# Patient Record
Sex: Male | Born: 1960 | Race: White | Hispanic: No | Smoking: Never smoker
Health system: Southern US, Community
[De-identification: ages and names within clinical notes are randomized; demographics above are authoritative.]

## PROBLEM LIST (undated history)

## (undated) DIAGNOSIS — K859 Acute pancreatitis without necrosis or infection, unspecified: Secondary | ICD-10-CM

## (undated) DIAGNOSIS — F1020 Alcohol dependence, uncomplicated: Secondary | ICD-10-CM

---

## 2003-07-08 ENCOUNTER — Inpatient Hospital Stay (HOSPITAL_COMMUNITY): Admission: AD | Admit: 2003-07-08 | Discharge: 2003-07-12 | Payer: Self-pay | Admitting: Psychiatry

## 2019-06-08 ENCOUNTER — Other Ambulatory Visit: Payer: Self-pay

## 2019-06-08 DIAGNOSIS — Z20822 Contact with and (suspected) exposure to covid-19: Secondary | ICD-10-CM

## 2019-06-13 LAB — NOVEL CORONAVIRUS, NAA: SARS-CoV-2, NAA: NOT DETECTED

## 2019-07-27 ENCOUNTER — Emergency Department (HOSPITAL_COMMUNITY): Payer: Self-pay

## 2019-07-27 ENCOUNTER — Encounter (HOSPITAL_COMMUNITY): Payer: Self-pay

## 2019-07-27 ENCOUNTER — Emergency Department (HOSPITAL_COMMUNITY)
Admission: EM | Admit: 2019-07-27 | Discharge: 2019-07-27 | Disposition: A | Discharge status: Home / Self Care | Payer: Self-pay | Attending: Emergency Medicine | Admitting: Emergency Medicine

## 2019-07-27 ENCOUNTER — Other Ambulatory Visit: Payer: Self-pay

## 2019-07-27 DIAGNOSIS — R911 Solitary pulmonary nodule: Secondary | ICD-10-CM | POA: Diagnosis present

## 2019-07-27 DIAGNOSIS — R1010 Upper abdominal pain, unspecified: Secondary | ICD-10-CM

## 2019-07-27 DIAGNOSIS — F1010 Alcohol abuse, uncomplicated: Secondary | ICD-10-CM | POA: Insufficient documentation

## 2019-07-27 DIAGNOSIS — Z20828 Contact with and (suspected) exposure to other viral communicable diseases: Secondary | ICD-10-CM | POA: Insufficient documentation

## 2019-07-27 DIAGNOSIS — R933 Abnormal findings on diagnostic imaging of other parts of digestive tract: Secondary | ICD-10-CM | POA: Diagnosis present

## 2019-07-27 DIAGNOSIS — F10929 Alcohol use, unspecified with intoxication, unspecified: Secondary | ICD-10-CM | POA: Insufficient documentation

## 2019-07-27 DIAGNOSIS — K860 Alcohol-induced chronic pancreatitis: Secondary | ICD-10-CM | POA: Insufficient documentation

## 2019-07-27 DIAGNOSIS — Z8551 Personal history of malignant neoplasm of bladder: Secondary | ICD-10-CM | POA: Insufficient documentation

## 2019-07-27 DIAGNOSIS — K8689 Other specified diseases of pancreas: Secondary | ICD-10-CM | POA: Diagnosis present

## 2019-07-27 DIAGNOSIS — R109 Unspecified abdominal pain: Secondary | ICD-10-CM

## 2019-07-27 HISTORY — DX: Acute pancreatitis without necrosis or infection, unspecified: K85.90

## 2019-07-27 HISTORY — DX: Alcohol dependence, uncomplicated: F10.20

## 2019-07-27 LAB — COMPREHENSIVE METABOLIC PANEL
ALT: 51 U/L — ABNORMAL HIGH (ref 0–44)
AST: 105 U/L — ABNORMAL HIGH (ref 15–41)
Albumin: 4.1 g/dL (ref 3.5–5.0)
Alkaline Phosphatase: 87 U/L (ref 38–126)
Anion gap: 16 — ABNORMAL HIGH (ref 5–15)
BUN: 14 mg/dL (ref 6–20)
CO2: 25 mmol/L (ref 22–32)
Calcium: 8.5 mg/dL — ABNORMAL LOW (ref 8.9–10.3)
Chloride: 94 mmol/L — ABNORMAL LOW (ref 98–111)
Creatinine, Ser: 0.79 mg/dL (ref 0.61–1.24)
GFR calc Af Amer: >60 mL/min (ref >60)
GFR calc non Af Amer: >60 mL/min (ref >60)
Glucose, Bld: 182 mg/dL — ABNORMAL HIGH (ref 70–99)
Potassium: 3 mmol/L — ABNORMAL LOW (ref 3.5–5.1)
Sodium: 135 mmol/L (ref 135–145)
Total Bilirubin: 1.4 mg/dL — ABNORMAL HIGH (ref 0.3–1.2)
Total Protein: 7 g/dL (ref 6.5–8.1)

## 2019-07-27 LAB — BASIC METABOLIC PANEL
Anion gap: 14 (ref 5–15)
BUN: 14 mg/dL (ref 6–20)
CO2: 23 mmol/L (ref 22–32)
Calcium: 7.6 mg/dL — ABNORMAL LOW (ref 8.9–10.3)
Chloride: 97 mmol/L — ABNORMAL LOW (ref 98–111)
Creatinine, Ser: 0.83 mg/dL (ref 0.61–1.24)
GFR calc Af Amer: >60 mL/min (ref >60)
GFR calc non Af Amer: >60 mL/min (ref >60)
Glucose, Bld: 134 mg/dL — ABNORMAL HIGH (ref 70–99)
Potassium: 3.7 mmol/L (ref 3.5–5.1)
Sodium: 134 mmol/L — ABNORMAL LOW (ref 135–145)

## 2019-07-27 LAB — CBC WITH DIFFERENTIAL/PLATELET
Abs Immature Granulocytes: 0.01 10*3/uL (ref 0.00–0.07)
Basophils Absolute: 0 10*3/uL (ref 0.0–0.1)
Basophils Relative: 0 %
Eosinophils Absolute: 0 10*3/uL (ref 0.0–0.5)
Eosinophils Relative: 1 %
HCT: 46.7 % (ref 39.0–52.0)
Hemoglobin: 16.7 g/dL (ref 13.0–17.0)
Immature Granulocytes: 0 %
Lymphocytes Relative: 18 %
Lymphs Abs: 0.9 10*3/uL (ref 0.7–4.0)
MCH: 30.5 pg (ref 26.0–34.0)
MCHC: 35.8 g/dL (ref 30.0–36.0)
MCV: 85.4 fL (ref 80.0–100.0)
Monocytes Absolute: 0.7 10*3/uL (ref 0.1–1.0)
Monocytes Relative: 13 %
Neutro Abs: 3.5 10*3/uL (ref 1.7–7.7)
Neutrophils Relative %: 68 %
Platelets: 173 10*3/uL (ref 150–400)
RBC: 5.47 MIL/uL (ref 4.22–5.81)
RDW: 12.1 % (ref 11.5–15.5)
WBC: 5.2 10*3/uL (ref 4.0–10.5)
nRBC: 0 % (ref 0.0–0.2)

## 2019-07-27 LAB — RAPID URINE DRUG SCREEN, HOSP PERFORMED
Amphetamines: NOT DETECTED
Barbiturates: NOT DETECTED
Benzodiazepines: NOT DETECTED
Cocaine: NOT DETECTED
Opiates: NOT DETECTED
Tetrahydrocannabinol: POSITIVE — AB

## 2019-07-27 LAB — SARS CORONAVIRUS 2 BY RT PCR (HOSPITAL ORDER, PERFORMED IN ~~LOC~~ HOSPITAL LAB): SARS Coronavirus 2: NEGATIVE

## 2019-07-27 LAB — URINALYSIS, ROUTINE W REFLEX MICROSCOPIC
Bilirubin Urine: NEGATIVE
Glucose, UA: NEGATIVE mg/dL
Hgb urine dipstick: NEGATIVE
Ketones, ur: NEGATIVE mg/dL
Leukocytes,Ua: NEGATIVE
Nitrite: NEGATIVE
Protein, ur: NEGATIVE mg/dL
Specific Gravity, Urine: 1.013 (ref 1.005–1.030)
pH: 7 (ref 5.0–8.0)

## 2019-07-27 LAB — TROPONIN I (HIGH SENSITIVITY)
Troponin I (High Sensitivity): 4 ng/L (ref <18)
Troponin I (High Sensitivity): 3 ng/L (ref <18)

## 2019-07-27 LAB — LIPASE, BLOOD: Lipase: 12 U/L (ref 11–51)

## 2019-07-27 LAB — ETHANOL: Alcohol, Ethyl (B): 133 mg/dL — ABNORMAL HIGH (ref <10)

## 2019-07-27 MED ORDER — FAMOTIDINE IN NACL 20-0.9 MG/50ML-% IV SOLN
20.0000 mg | Freq: Once | INTRAVENOUS | Status: AC
  Administered 2019-07-27: 20 mg via INTRAVENOUS
  Filled 2019-07-27: qty 50

## 2019-07-27 MED ORDER — CHLORDIAZEPOXIDE HCL 25 MG PO CAPS
25.0000 mg | ORAL_CAPSULE | Freq: Once | ORAL | Status: AC
  Administered 2019-07-27: 25 mg via ORAL
  Filled 2019-07-27: qty 1

## 2019-07-27 MED ORDER — LORAZEPAM 2 MG/ML IJ SOLN
1.0000 mg | Freq: Once | INTRAMUSCULAR | Status: AC
  Administered 2019-07-27: 17:00:00 1 mg via INTRAVENOUS
  Filled 2019-07-27: qty 1

## 2019-07-27 MED ORDER — SUCRALFATE 1 G PO TABS: 1.0000 g | ORAL_TABLET | Freq: Three times a day (TID) | ORAL | 0 refills | Status: AC

## 2019-07-27 MED ORDER — ONDANSETRON HCL 4 MG/2ML IJ SOLN
4.0000 mg | Freq: Once | INTRAMUSCULAR | Status: AC
  Administered 2019-07-27: 4 mg via INTRAVENOUS
  Filled 2019-07-27: qty 2

## 2019-07-27 MED ORDER — PANTOPRAZOLE SODIUM 20 MG PO TBEC: 20.0000 mg | DELAYED_RELEASE_TABLET | Freq: Every day | ORAL | 0 refills | Status: AC

## 2019-07-27 MED ORDER — HYDROMORPHONE HCL 1 MG/ML IJ SOLN
0.5000 mg | Freq: Once | INTRAMUSCULAR | Status: AC
  Administered 2019-07-27: 14:00:00 0.5 mg via INTRAVENOUS
  Filled 2019-07-27: qty 1

## 2019-07-27 MED ORDER — PROMETHAZINE HCL 25 MG RE SUPP: 25.0000 mg | Freq: Four times a day (QID) | RECTAL | 0 refills | Status: AC | PRN

## 2019-07-27 MED ORDER — POTASSIUM CHLORIDE CRYS ER 20 MEQ PO TBCR
40.0000 meq | EXTENDED_RELEASE_TABLET | Freq: Once | ORAL | Status: AC
  Administered 2019-07-27: 40 meq via ORAL
  Filled 2019-07-27: qty 2

## 2019-07-27 MED ORDER — PROCHLORPERAZINE EDISYLATE 10 MG/2ML IJ SOLN
5.0000 mg | Freq: Once | INTRAMUSCULAR | Status: AC
  Administered 2019-07-27: 5 mg via INTRAVENOUS
  Filled 2019-07-27: qty 2

## 2019-07-27 MED ORDER — HYDROMORPHONE HCL 1 MG/ML IJ SOLN
0.5000 mg | Freq: Once | INTRAMUSCULAR | Status: AC
  Administered 2019-07-27: 0.5 mg via INTRAVENOUS
  Filled 2019-07-27: qty 1

## 2019-07-27 MED ORDER — AMOXICILLIN-POT CLAVULANATE 875-125 MG PO TABS: 1.0000 | ORAL_TABLET | Freq: Two times a day (BID) | ORAL | 0 refills | Status: AC

## 2019-07-27 MED ORDER — THIAMINE HCL 100 MG/ML IJ SOLN
Freq: Once | INTRAVENOUS | Status: AC
  Administered 2019-07-27: 12:00:00 via INTRAVENOUS
  Filled 2019-07-27: qty 1000

## 2019-07-27 MED ORDER — SODIUM CHLORIDE 0.9 % IV SOLN
INTRAVENOUS | Status: DC | PRN
  Administered 2019-07-27: 500 mL via INTRAVENOUS

## 2019-07-27 MED ORDER — CHLORDIAZEPOXIDE HCL 25 MG PO CAPS: ORAL_CAPSULE | ORAL | 0 refills | Status: AC

## 2019-07-27 MED ORDER — IOHEXOL 300 MG/ML  SOLN
100.0000 mL | Freq: Once | INTRAMUSCULAR | Status: AC | PRN
  Administered 2019-07-27: 100 mL via INTRAVENOUS

## 2019-07-27 MED ORDER — PANTOPRAZOLE SODIUM 40 MG IV SOLR
40.0000 mg | Freq: Once | INTRAVENOUS | Status: AC
  Administered 2019-07-27: 10:00:00 40 mg via INTRAVENOUS
  Filled 2019-07-27: qty 40

## 2019-07-27 NOTE — ED Triage Notes (Signed)
Pt reports history of pancreatitis.  Has been sober for 10 years and started drinking again 5 days ago.  C/O upper abd pain, n/v/d.

## 2019-07-27 NOTE — ED Provider Notes (Signed)
I assumed care of patient from previous team, please see their note for full H and P.  Patient is here with a history of pancreatitis, he has been sober for 10 years and started drinking 2 weeks ago.   Physical Exam  BP (!) 150/92   Pulse 96   Temp 98 F (36.7 C)   Resp 15   Ht 5\' 7"  (1.702 m)   Wt 59 kg   SpO2 100%   BMI 20.36 kg/m   Physical Exam Vitals signs and nursing note reviewed.  Constitutional:      General: He is not in acute distress.    Appearance: He is well-developed. He is not diaphoretic.  HENT:     Head: Normocephalic and atraumatic.  Eyes:     General: No scleral icterus.       Right eye: No discharge.        Left eye: No discharge.     Conjunctiva/sclera: Conjunctivae normal.  Neck:     Musculoskeletal: Normal range of motion.  Cardiovascular:     Rate and Rhythm: Normal rate and regular rhythm.  Pulmonary:     Effort: Pulmonary effort is normal. No respiratory distress.     Breath sounds: No stridor.  Abdominal:     General: There is no distension.  Musculoskeletal:        General: No deformity.  Skin:    General: Skin is warm and dry.  Neurological:     General: No focal deficit present.     Mental Status: He is alert.     Motor: No abnormal muscle tone.  Psychiatric:        Mood and Affect: Mood is anxious.        Behavior: Behavior normal.     ED Course/Procedures   Clinical Course as of Jul 27 14  Thu Jul 27, 2019  1660 Repeat BMP shows that anion gap is resolved, potassium is appropriately increased from 3 up to 3.7  Anion gap: 14 [EH]  1943 Results discussed with patient.    [EH]    Clinical Course User Index [EH] Cristina Gong, PA-C    Procedures  Ct Abdomen Pelvis W Contrast  Result Date: 07/27/2019 CLINICAL DATA:  Chronic pancreatitis.  Possible pseudocyst. EXAM: CT ABDOMEN AND PELVIS WITH CONTRAST TECHNIQUE: Multidetector CT imaging of the abdomen and pelvis was performed using the standard protocol following  bolus administration of intravenous contrast. CONTRAST:  OMNIPAQUE IOHEXOL 300 MG/ML  SOLN COMPARISON:  None. FINDINGS: Lower chest: Normal heart size. Lung bases are clear. No large area pulmonary consolidation. There is a 2 mm left lower lobe nodule. No pleural effusion. Hepatobiliary: The liver is normal in size and contour. Subcentimeter too small to characterize low-attenuation lesions within the right and left hepatic lobes. Prior cholecystectomy. No intrahepatic or extrahepatic biliary ductal dilatation. Pancreas: Atrophy of the pancreatic head/uncinate process with focal calcifications. Within the pancreatic body there is a 1.6 x 1.2 cm low-attenuation lesion (image 24; series 2). Mild atrophy of the pancreatic body and tail. Spleen: Unremarkable Adrenals/Urinary Tract: Normal adrenal glands. 1.3 cm cyst mid pole right kidney. Kidneys enhance symmetrically with contrast. Urinary bladder is unremarkable. Stomach/Bowel: There is circumferential wall thickening of the descending and sigmoid colon extending to the level of the rectum. Additionally, there is focal wall thickening involving the cecum (image 45; series 2). Normal morphology of the stomach. No significant free fluid or free intraperitoneal air. Vascular/Lymphatic: Normal caliber abdominal aorta. No retroperitoneal lymphadenopathy.  Multiple upper abdominal collateral vessels are demonstrated. Reproductive: Unremarkable. Other: Small focal nonspecific calcifications within the mesentery (image 44; series 2). Musculoskeletal: Osseous demineralization. Lumbar spine degenerative changes. IMPRESSION: There is a 1.6 cm low-attenuation lesion within the pancreatic body. No surrounding inflammatory change. This needs dedicated evaluation with pre and post contrast-enhanced MRI in the non acute setting. Calcifications within the pancreatic head/uncinate process as can be seen with chronic calcific pancreatitis. Marked wall thickening of the descending  and sigmoid colon as can be seen with colitis. There is focal wall thickening involving the cecum which is nonspecific. If the patient has not had recent colonoscopy, recommend further evaluation with colonoscopy after resolution of the acute symptomatology to exclude underlying colonic lesion. 2 mm left lower lobe pulmonary nodule. No follow-up needed if patient is low-risk. Non-contrast chest CT can be considered in 12 months if patient is high-risk. This recommendation follows the consensus statement: Guidelines for Management of Incidental Pulmonary Nodules Detected on CT Images: From the Fleischner Society 2017; Radiology 2017; 284:228-243. Electronically Signed   By: Annia Beltrew  Davis M.D.   On: 07/27/2019 18:50   [place] Abdomen Limited Ruq  Addendum Date: 07/27/2019   ADDENDUM REPORT: 07/27/2019 14:22 ADDENDUM: Following the initial exam, additional images of the pancreas were obtained and submitted for interpretation. FINDINGS: There is an ovoid hypoechoic structure in the region of the pancreatic head/neck with no internal vascularity on color Doppler. This area measures approximately 15 x 17 x 12 mm. It is unclear whether if this is within or adjacent to the pancreas. The visualized portions of the pancreas do not appear edematous. No definite areas peripancreatic fluid identified. No hyperemia of the peripancreatic tissues. The distal pancreatic body and tail segments as well as the uncinate process were not seen secondary to shadowing from bowel gas. IMPRESSION: 1. Limited sonographic evaluation of the pancreas secondary to shadowing from overlying bowel gas. 2. A small ovoid hypoechoic area in the region of the pancreatic head/neck which could represent a lesion within the pancreas versus a thrombosed vascular structure such as the superior mesenteric vein. Further evaluation of this finding with CT of the abdomen in the portal venous phase is recommended. Electronically Signed   By: Duanne GuessNicholas  Plundo M.D.    On: 07/27/2019 14:22   Result Date: 07/27/2019 CLINICAL DATA:  Epigastric pain EXAM: ULTRASOUND ABDOMEN LIMITED RIGHT UPPER QUADRANT COMPARISON:  None. FINDINGS: Gallbladder: Surgically absent. Common bile duct: Diameter: 11 mm Liver: No focal lesion identified. Within normal limits in parenchymal echogenicity. Portal vein is patent on color Doppler imaging with normal direction of blood flow towards the liver. Other: None. IMPRESSION: 1. Essentially unremarkable right upper quadrant ultrasound status post cholecystectomy. 2. Mildly prominent common bile duct, likely normal postsurgical appearance. Electronically Signed: By: Duanne GuessNicholas  Plundo M.D. On: 07/27/2019 12:34    Labs Reviewed  COMPREHENSIVE METABOLIC PANEL - Abnormal; Notable for the following components:      Result Value   Potassium 3.0 (*)    Chloride 94 (*)    Glucose, Bld 182 (*)    Calcium 8.5 (*)    AST 105 (*)    ALT 51 (*)    Total Bilirubin 1.4 (*)    Anion gap 16 (*)    All other components within normal limits  ETHANOL - Abnormal; Notable for the following components:   Alcohol, Ethyl (B) 133 (*)    All other components within normal limits  RAPID URINE DRUG SCREEN, HOSP PERFORMED - Abnormal; Notable for the following  components:   Tetrahydrocannabinol POSITIVE (*)    All other components within normal limits  BASIC METABOLIC PANEL - Abnormal; Notable for the following components:   Sodium 134 (*)    Chloride 97 (*)    Glucose, Bld 134 (*)    Calcium 7.6 (*)    All other components within normal limits  SARS CORONAVIRUS 2 (HOSPITAL ORDER, [facility])  LIPASE, BLOOD  CBC WITH DIFFERENTIAL/PLATELET  URINALYSIS, ROUTINE W REFLEX MICROSCOPIC  TROPONIN I (HIGH SENSITIVITY)  TROPONIN I (HIGH SENSITIVITY)    MDM  Plan is to follow-up on CT scan.  Patient most likely will be able to be discharged home.    CT scan shows multiple incidental findings, all of which were discussed with the  patient along with the need for follow-up.  CT scan shows evidence of colitis.  Based on his history of alcohol use Cipro Flagyl would not be an appropriate combination and he will be discharged with Augmentin instead.  He will be given a Librium taper.  PMP was consulted.  He wishes to stop drinking alcohol.  We discussed risks of taking multiple sedating substances including drinking alcohol while taking Librium and he states his understanding.  His pain is consistent with his chronic pancreatitis.  Recommended a clear liquid diet for 2 to 3 days and then increasing diet as tolerated.  We will also treat for alcoholic gastritis with Protonix and Carafate.  He did initially have a slight anion gap, however repeat BMP was improved.    Return precautions were discussed with patient who states their understanding.  At the time of discharge patient denied any unaddressed complaints or concerns.  Patient is agreeable for discharge home.  Allergies as of 07/27/2019   No Known Allergies     Medication List    TAKE these medications   amoxicillin-clavulanate 875-125 MG tablet Commonly known as: AUGMENTIN Take 1 tablet by mouth every 12 (twelve) hours.   chlordiazePOXIDE 25 MG capsule Commonly known as: LIBRIUM 50mg  PO TID x 1D, then 25-50mg  PO BID X 1D, then 25-50mg  PO QD X 1D   pantoprazole 20 MG tablet Commonly known as: PROTONIX Take 1 tablet (20 mg total) by mouth daily.   promethazine 25 MG suppository Commonly known as: PHENERGAN Place 1 suppository (25 mg total) rectally every 6 (six) hours as needed for nausea or vomiting.   sucralfate 1 g tablet Commonly known as: Carafate Take 1 tablet (1 g total) by mouth 4 (four) times daily -  with meals and at bedtime for 14 days.     ASK your doctor about these medications   Benadryl Allergy 25 mg capsule Generic drug: diphenhydrAMINE Take 25 mg by mouth at bedtime as needed.   Creon 36000 UNITS Cpep capsule Generic drug:  lipase/protease/amylase Take 2 capsules by mouth 3 (three) times daily.   omeprazole 20 MG capsule Commonly known as: PRILOSEC Take 1 capsule by mouth daily as needed.         Lorin Glass, PA-C 07/28/19 0018    Hayden Rasmussen, MD 07/28/19 1058

## 2019-07-27 NOTE — ED Notes (Signed)
Pt ambulated to bathroom in hallway with steady gait

## 2019-07-27 NOTE — ED Provider Notes (Signed)
[facility] EMERGENCY DEPARTMENT Provider Note   CSN: [id number] Arrival date & time: 07/27/19  0908     History   Chief Complaint Chief Complaint  Patient presents with  . Abdominal Pain    HPI Parker Payne is a 58 y.o. male.     Patient is a 58 year old male who presents to the emergency department with a complaint of abdominal pain.  The patient has a history of chronic pancreatitis, diverticulosis, alcoholism, postcholecystectomy, and bladder cancer.  The patient states that he has chronic pancreatitis.  He complains of increasing pain mostly in the epigastric area and just under the sternum.  At times the pain radiates to his back.  He says he had been sober for approximately 10years, but in the last 2 weeks he had restarted drinking.  His last drink was a few hours before he arrived in the emergency department.  The patient states that he has been having increasing shortness of breath since starting back drinking.  He says that he continued drinking because he was fearful of delirium tremens or other alcohol related problems.  The patient denies any rectal bleeding, he has not had any fever or chills.  He has had some diarrhea, that he says is oily and loose.  He says he is experiencing some similar stools when his pancreas was acting up.  The patient denies any hemoptysis, no melena reported.  The patient denies any recent injury or trauma to the abdomen.  No recent operations or procedures to be reported.  The patient denies use of tobacco products or recreational drugs.  He presents to the emergency department for evaluation and for assistance with his pain.  The history is provided by the patient.  Abdominal Pain Associated symptoms: diarrhea and nausea   Associated symptoms: no chest pain, no chills, no cough, no dysuria, no fever, no hematuria and no shortness of breath     Past Medical History:  Diagnosis Date  . Alcoholism (HCC)   . Pancreatitis     There are no  active problems to display for this patient.   History reviewed. No pertinent surgical history.      Home Medications    Prior to Admission medications   Not on File    Family History No family history on file.  Social History Social History   Tobacco Use  . Smoking status: Never Smoker  . Smokeless tobacco: Never Used  Substance Use Topics  . Alcohol use: Yes    Comment: daily x 5 days  . Drug use: Never     Allergies   Patient has no known allergies.   Review of Systems Review of Systems  Constitutional: Negative for activity change, chills and fever.       All ROS Neg except as noted in HPI  HENT: Negative.   Eyes: Negative for photophobia and discharge.  Respiratory: Negative for cough, shortness of breath and wheezing.   Cardiovascular: Negative for chest pain and palpitations.  Gastrointestinal: Positive for abdominal pain, diarrhea and nausea. Negative for anal bleeding and blood in stool.  Genitourinary: Negative for dysuria, frequency and hematuria.  Musculoskeletal: Negative for arthralgias, back pain and neck pain.  Skin: Negative.   Neurological: Negative for dizziness, seizures and speech difficulty.  Psychiatric/Behavioral: Negative for confusion and hallucinations.     Physical Exam Updated Vital Signs BP (!) 148/94   Pulse 90   Temp 98 F (36.7 C)   Resp 11   Ht 5\' 7"  (1.702 m)  Wt 59 kg   SpO2 100%   BMI 20.36 kg/m   Physical Exam Vitals signs and nursing note reviewed.  Constitutional:      Appearance: He is well-developed. He is not toxic-appearing.  HENT:     Head: Normocephalic.     Right Ear: Tympanic membrane and external ear normal.     Left Ear: Tympanic membrane and external ear normal.  Eyes:     General: Lids are normal.     Pupils: Pupils are equal, round, and reactive to light.  Neck:     Musculoskeletal: Normal range of motion and neck supple.     Vascular: No carotid bruit.  Cardiovascular:     Rate and  Rhythm: Normal rate and regular rhythm.     Pulses: Normal pulses.     Heart sounds: Normal heart sounds.  Pulmonary:     Effort: No respiratory distress.     Breath sounds: Normal breath sounds.  Abdominal:     General: Bowel sounds are normal.     Palpations: Abdomen is soft.     Tenderness: There is abdominal tenderness in the epigastric area. There is no guarding.     Comments: There is a small bruise at the right upper abdomen.  Musculoskeletal: Normal range of motion.  Lymphadenopathy:     Head:     Right side of head: No submandibular adenopathy.     Left side of head: No submandibular adenopathy.     Cervical: No cervical adenopathy.  Skin:    General: Skin is warm and dry.  Neurological:     Mental Status: He is alert and oriented to person, place, and time.     Cranial Nerves: No cranial nerve deficit.     Sensory: No sensory deficit.  Psychiatric:        Speech: Speech normal.      ED Treatments / Results  Labs (all labs ordered are listed, but only abnormal results are displayed) Labs Reviewed  SARS CORONAVIRUS 2 (HOSPITAL ORDER, PERFORMED IN [facility])  COMPREHENSIVE METABOLIC PANEL  ETHANOL  LIPASE, BLOOD  CBC WITH DIFFERENTIAL/PLATELET  RAPID URINE DRUG SCREEN, HOSP PERFORMED  URINALYSIS, ROUTINE W REFLEX MICROSCOPIC  TROPONIN I (HIGH SENSITIVITY)    EKG None  Radiology No results found.  Procedures Procedures (including critical care time)  Medications Ordered in ED Medications - No data to display   Initial Impression / Assessment and Plan / ED Course  I have reviewed the triage vital signs and the nursing notes.  Pertinent labs & imaging results that were available during my care of the patient were reviewed by me and considered in my medical decision making (see chart for details).  Clinical Course as of Jul 26 2117  Thu Jul 27, 2019  8088 Repeat BMP shows that anion gap is resolved, potassium is appropriately increased  from 3 up to 3.7  Anion gap: 14 [EH]  1943 Results discussed with patient.    [EH]    Clinical Course User Index [EH] Cristina Gong, PA-C         Final Clinical Impressions(s) / ED Diagnoses MDM  Patient appears uncomfortable.  Blood pressure is elevated at 148/94, otherwise vital signs are within normal limits.  Pulse oximetry is 100% on room air.  There is symmetrical rise and fall of the chest.  Patient states that he has been around someone who has tested positive for COVID.  Will obtain COVID-19 test.  The comprehensive metabolic  panel shows the potassium to be low at 3.  Oral potassium has been ordered for the patient.  The AST is elevated at 105, the ALT is elevated at 51, and the total bilirubin is elevated at 1.4.  The anion gap is elevated at 16.  The glucose is elevated at 182.  The alcohol level is 133.  Patient will be given a banana bag.  The troponin is normal at 4.  Doubt cardiac event.  The complete blood count is well within normal limits.  Recheck: Pt reports pain coming back after 1st dose of dilaudid and zofran. Repeat dose ordered.  COVID-19 test returned negative.  Ultrasound of the abdomen obtained.  It was read by the radiologist as being essentially unremarkable right upper quadrant ultrasound status post cholecystectomy.  There was mildly prominent common bile duct likely normal postsurgical appearance.  I have requested additional views to evaluate the pancreas.  Additional views reveal an ovoid hypoechoic structure in the region of the pancreatic head/neck with no internal vascularity on the color Doppler.  The area measures 15, by 17, by 97mm.  It is unclear whether or not this is within or adjacent to the pancreas.  A CT abdomen was suggested.  CT abdomen scan has been requested.  Notified by the nursing staff that the patient is fidgety and restless.  No visible or auditory hallucinations reported.  He is fearful that he is going into withdrawal  from his alcohol.  The patient had previously received a banana bag.  He is now given a milligram of lorazepam for assistance with his symptoms.  CT scan pending. Pt more comfortable after IV ativan. Pt care to be continued by E. Phylliss Bob, PA-C.   Final diagnoses:  Abdominal pain  Alcohol abuse  Chronic pancreatitis due to acute alcohol intoxication ([place])  Abnormal computed tomography of cecum and terminal ileum  Pancreatic mass  Lung nodule seen on imaging study    ED Discharge Orders    None       Lily Kocher, PA-C 07/27/19 2121    Noemi Chapel, MD 08/01/19 1023

## 2019-07-27 NOTE — ED Notes (Signed)
ED Provider at bedside. 

## 2019-07-27 NOTE — Discharge Instructions (Addendum)
Today you are getting multiple prescriptions. Librium-this is a medicine that can make you sleepy.  This will help make it more safe for you to stop drinking.  It is very important that you do not drink alcohol while taking this medicine Carafate-this medicine helps coat the lining of your stomach to help protect from irritation Protonix-this medicine helps with heartburn and helps reduce the amount of acid that your stomach makes Phenergan-this is a medicine that can help with nausea and vomiting.  This medicine can make you sleepy.  Please try not to take this unless you cannot tolerate any liquids. Augmentin-this medicine is an antibiotic to help treat your colitis  If you are unable to afford all of her medications the most important 1 is the Librium.  After that I would recommend that you get the Augmentin, Phenergan, Protonix, and Carafate.    Today your CT scan showed that you have a abnormal area in the pancreas.  You need to get a MRI(pre-and post contrast-enhanced) for further evaluation.  Your CT scan showed wall thickening of the colon which is consistent with colitis or inflammation.  There is an area in the cecum, part of the colon, that is thick.  You need a colonoscopy to further evaluate this.  You have a 2 mm left lobe pulmonary nodule.  Please follow-up with your primary care doctor about this.  If you are low risk then you do not need follow-up, if you are high risk you need a noncontrast CT scan of your chest in 12 months.  You may have diarrhea from the antibiotics.  It is very important that you continue to take the antibiotics even if you get diarrhea unless a medical professional tells you that you may stop taking them.  If you stop too early the bacteria you are being treated for will become stronger and you may need different, more powerful antibiotics that have more side effects and worsening diarrhea.  Please stay well hydrated and consider probiotics as they may  decrease the severity of your diarrhea.    All of these follow-ups are very important.   Today you received medications that may make you sleepy or impair your ability to make decisions.  For the next 24 hours please do not drive, operate heavy machinery, care for a small child with out another adult present, or perform any activities that may cause harm to you or someone else if you were to fall asleep or be impaired.   You are being prescribed a medication which may make you sleepy. Please follow up of listed precautions for at least 24 hours after taking one dose.

## 2020-08-21 IMAGING — CT CT ABD-PELV W/ CM
2 of 5 series · 15 of 46 positions shown, 17 images · IV contrast (omnipaque)
Comparison: None.

CLINICAL DATA: Chronic pancreatitis.  Possible pseudocyst.

EXAM:
CT ABDOMEN AND PELVIS WITH CONTRAST
TECHNIQUE: Multidetector CT imaging of the abdomen and pelvis was performed
using the standard protocol following bolus administration of
intravenous contrast.
CONTRAST:  100mL OMNIPAQUE IOHEXOL 300 MG/ML  SOLN

[Series 2: axial st · axial · 0.65mm/px · z∈[+906,+1236]mm · 12 of 80 slices shown, 14 images]
[im 7/80  soft-tissue]
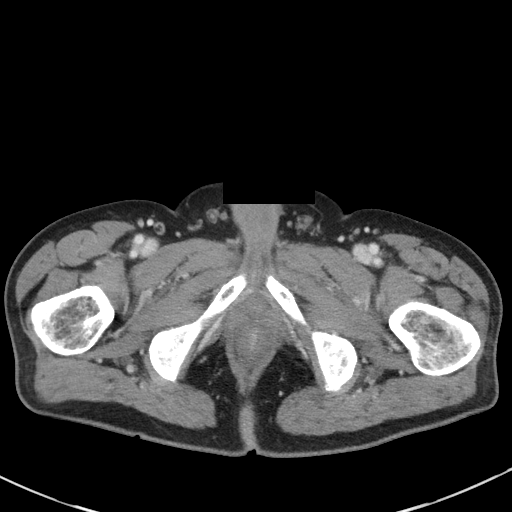
[im 7/80  bone]
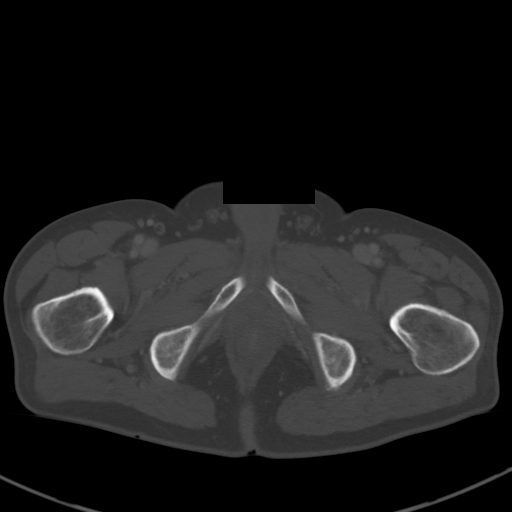
[im 13/80  soft-tissue]
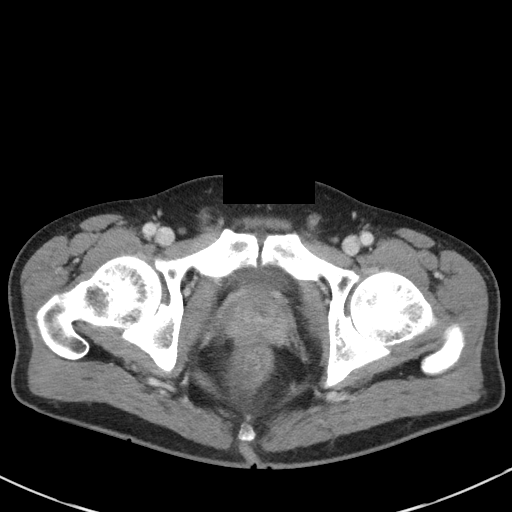
[im 19/80  soft-tissue]
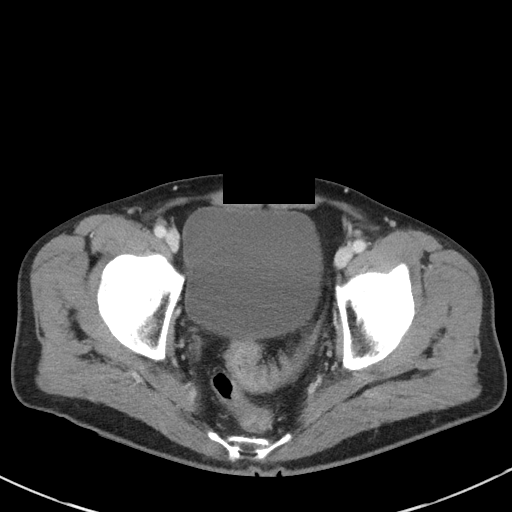
[im 25/80  soft-tissue]
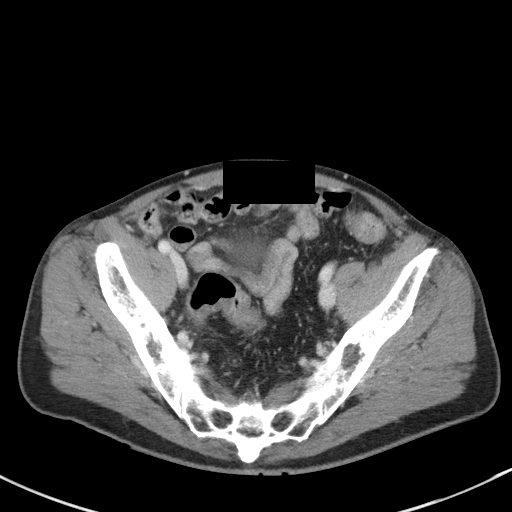
[im 31/80  soft-tissue]
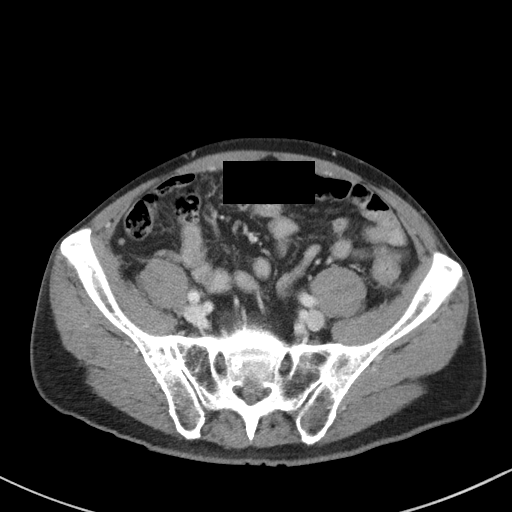
[im 37/80  soft-tissue]
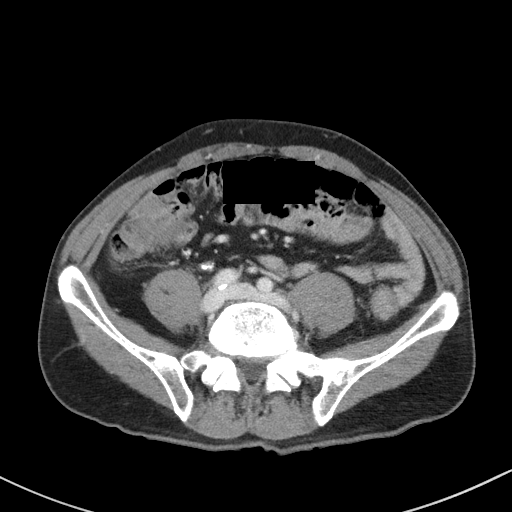
[im 43/80  soft-tissue]
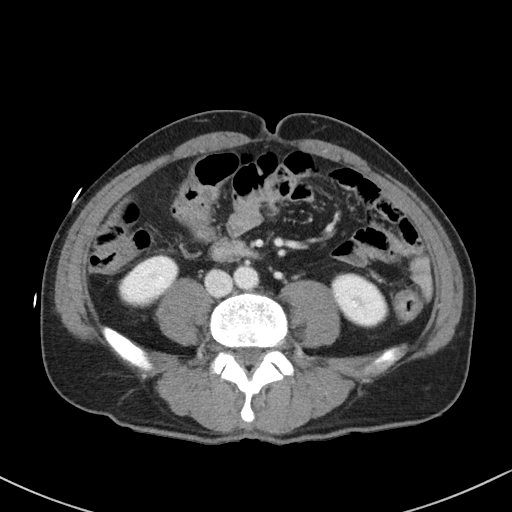
[im 49/80  soft-tissue]
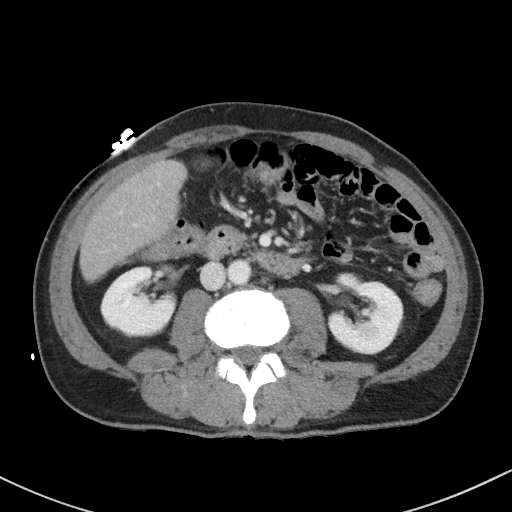
[im 55/80  soft-tissue]
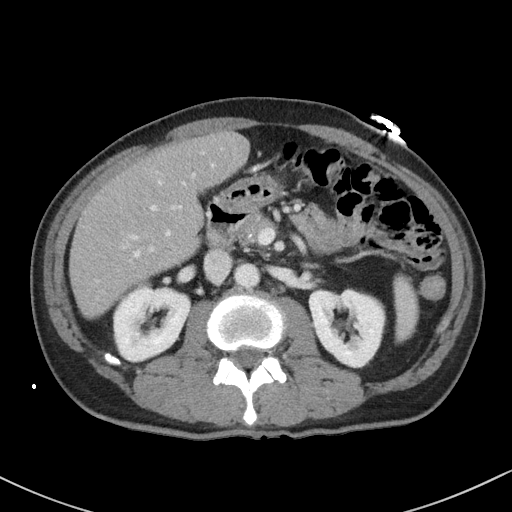
[im 55/80  bone]
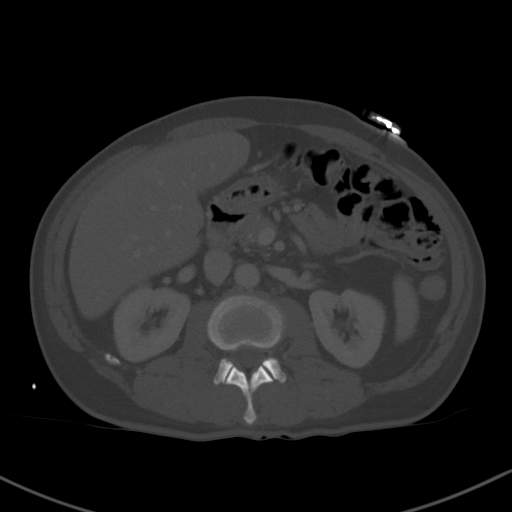
[im 61/80  soft-tissue]
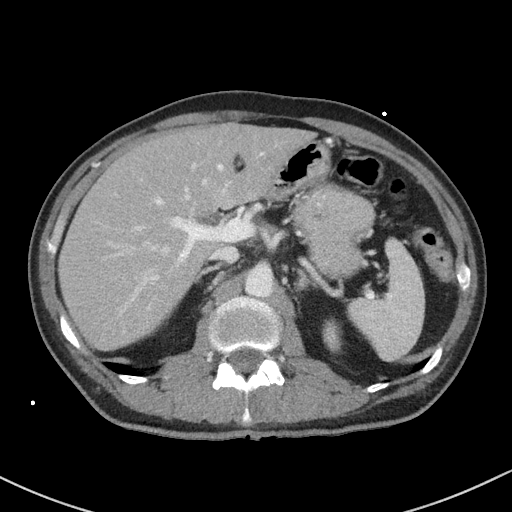
[im 67/80  soft-tissue]
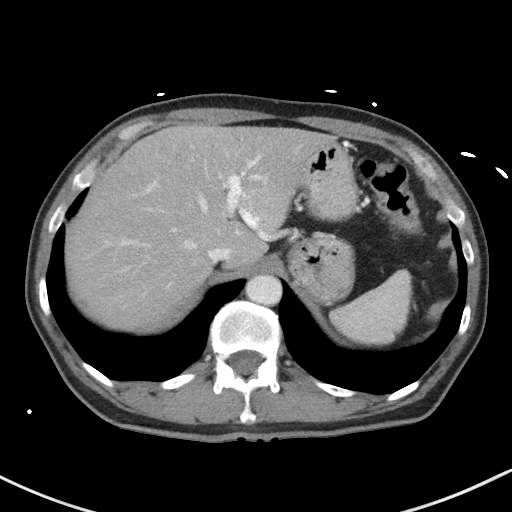
[im 73/80  soft-tissue]
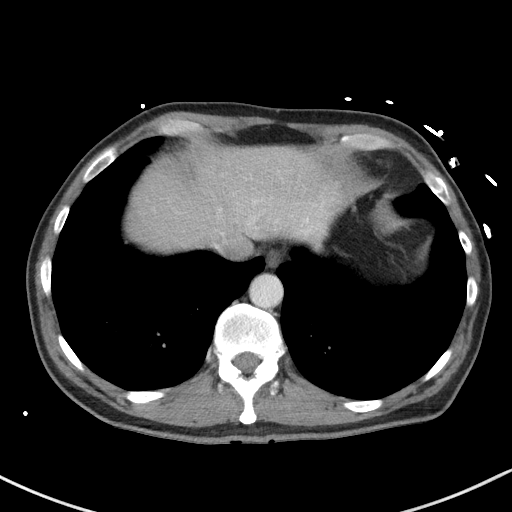

[Series 6: coronal st · coronal · 0.65mm/px · 3 of 85 slices shown]
[im 29/85  soft-tissue]
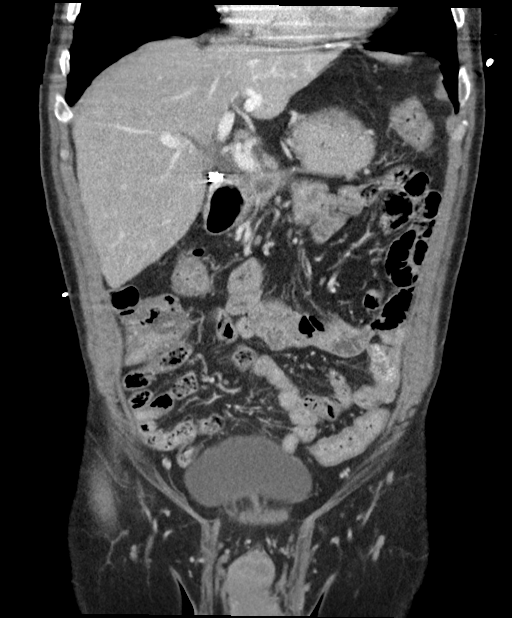
[im 38/85  soft-tissue]
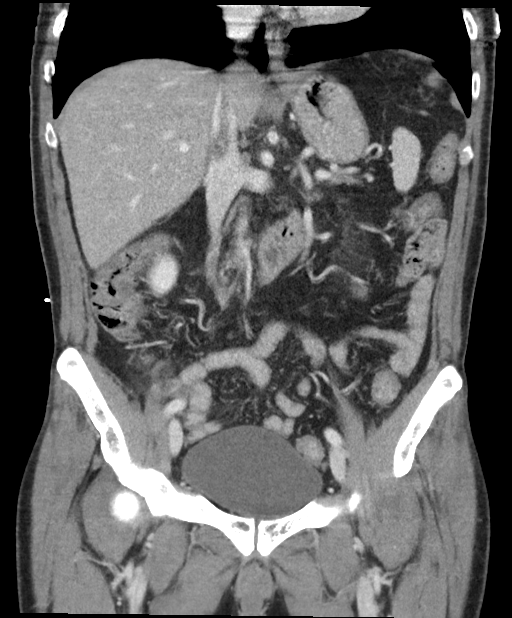
[im 47/85  soft-tissue]
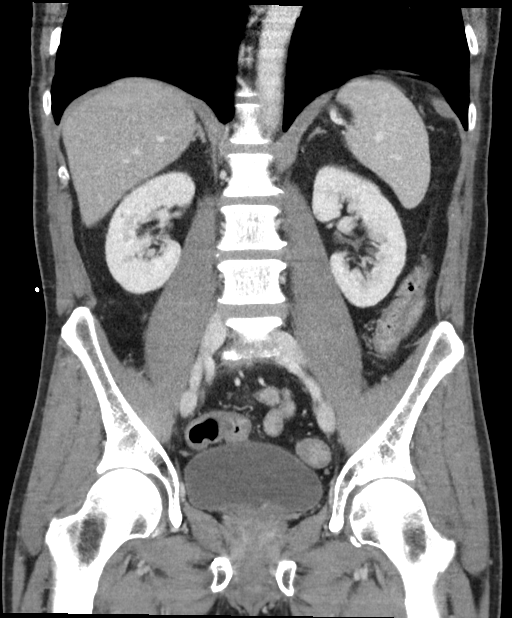

[15 of 46 positions shown; findings below may reference images not displayed]

FINDINGS: Lower chest: Normal heart size. Lung bases are clear. No large area
pulmonary consolidation. There is a 2 mm left lower lobe nodule. No
pleural effusion.

Hepatobiliary: The liver is normal in size and contour.
Subcentimeter too small to characterize low-attenuation lesions
within the right and left hepatic lobes. Prior cholecystectomy. No
intrahepatic or extrahepatic biliary ductal dilatation.

Pancreas: Atrophy of the pancreatic head/uncinate process with focal
calcifications. Within the pancreatic body there is a 1.6 x 1.2 cm
low-attenuation lesion (image 24; series 2). Mild atrophy of the
pancreatic body and tail.

Spleen: Unremarkable

Adrenals/Urinary Tract: Normal adrenal glands. 1.3 cm cyst mid pole
right kidney. Kidneys enhance symmetrically with contrast. Urinary
bladder is unremarkable.

Stomach/Bowel: There is circumferential wall thickening of the
descending and sigmoid colon extending to the level of the rectum.
Additionally, there is focal wall thickening involving the cecum
(image 45; series 2). Normal morphology of the stomach. No
significant free fluid or free intraperitoneal air.

Vascular/Lymphatic: Normal caliber abdominal aorta. No
retroperitoneal lymphadenopathy. Multiple upper abdominal collateral
vessels are demonstrated.

Reproductive: Unremarkable.

Other: Small focal nonspecific calcifications within the mesentery
(image 44; series 2).

Musculoskeletal: Osseous demineralization. Lumbar spine degenerative
changes.
IMPRESSION: There is a 1.6 cm low-attenuation lesion within the pancreatic body.
No surrounding inflammatory change. This needs dedicated evaluation
with pre and post contrast-enhanced MRI in the non acute setting.

Calcifications within the pancreatic head/uncinate process as can be
seen with chronic calcific pancreatitis.

Marked wall thickening of the descending and sigmoid colon as can be
seen with colitis.

There is focal wall thickening involving the cecum which is
nonspecific. If the patient has not had recent colonoscopy,
recommend further evaluation with colonoscopy after resolution of
the acute symptomatology to exclude underlying colonic lesion.

2 mm left lower lobe pulmonary nodule. No follow-up needed if
patient is low-risk. Non-contrast chest CT can be considered in 12
months if patient is high-risk. This recommendation follows the
consensus statement: Guidelines for Management of Incidental
Pulmonary Nodules Detected on CT Images: From the [HOSPITAL]

## 2020-12-24 DEATH — deceased
